# Patient Record
Sex: Male | Born: 1950 | Race: White | Hispanic: No | Marital: Married | Smoking: Never smoker
Health system: Southern US, Community
[De-identification: ages and names within clinical notes are randomized; demographics above are authoritative.]

## PROBLEM LIST (undated history)

## (undated) DIAGNOSIS — E785 Hyperlipidemia, unspecified: Secondary | ICD-10-CM

## (undated) DIAGNOSIS — I10 Essential (primary) hypertension: Secondary | ICD-10-CM

## (undated) HISTORY — DX: Essential (primary) hypertension: I10

## (undated) HISTORY — DX: Hyperlipidemia, unspecified: E78.5

---

## 1987-07-17 HISTORY — PX: KNEE SURGERY: SHX244

## 2015-01-21 ENCOUNTER — Encounter: Payer: Self-pay | Admitting: General Surgery

## 2015-01-21 ENCOUNTER — Encounter: Payer: Self-pay | Admitting: *Deleted

## 2015-01-21 ENCOUNTER — Ambulatory Visit (INDEPENDENT_AMBULATORY_CARE_PROVIDER_SITE_OTHER): Payer: Worker's Compensation | Admitting: General Surgery

## 2015-01-21 VITALS — BP 142/72 | HR 68 | Resp 15

## 2015-01-21 DIAGNOSIS — S8011XA Contusion of right lower leg, initial encounter: Secondary | ICD-10-CM | POA: Diagnosis not present
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
## 2015-01-21 NOTE — Patient Instructions (Addendum)
Shower as usual, pat dry. Return in one week.Advised on rest and elevation of leg for next 2-3 days.

## 2015-01-27 ENCOUNTER — Ambulatory Visit (INDEPENDENT_AMBULATORY_CARE_PROVIDER_SITE_OTHER): Payer: Worker's Compensation | Admitting: General Surgery

## 2015-01-27 ENCOUNTER — Encounter: Payer: Self-pay | Admitting: General Surgery

## 2015-01-27 VITALS — BP 140/76 | Ht 78.0 in | Wt 239.0 lb

## 2015-01-27 DIAGNOSIS — S8011XD Contusion of right lower leg, subsequent encounter: Secondary | ICD-10-CM

## 2015-01-27 NOTE — Progress Notes (Signed)
Patient ID: Larry Keller, male   DOB: June 05, 1951, 64 y.o.   MRN: [id number] Here for follow up from a right leg hematoma I&D. He has had some drainage from the area. He does have some edema in the right foot and ankle. He has finished his antibiotics.   The drainage site is clean with some serous drainage. No surrounding redness and the swelling has subsided significantly. Still has mild edema lower third of leg and ankle.  Patient to return as needed.  PCP: Dr Jerl MinaJames Hedrick

## 2015-01-27 NOTE — Patient Instructions (Signed)
Allow the area to drain. Return as needed.

## 2015-01-29 ENCOUNTER — Other Ambulatory Visit: Payer: Self-pay | Admitting: General Surgery

## 2015-01-29 MED ORDER — DOXYCYCLINE HYCLATE 100 MG PO TABS: 100.0000 mg | ORAL_TABLET | Freq: Two times a day (BID) | ORAL | Status: DC

## 2015-01-31 ENCOUNTER — Ambulatory Visit (INDEPENDENT_AMBULATORY_CARE_PROVIDER_SITE_OTHER): Payer: Worker's Compensation | Admitting: General Surgery

## 2015-01-31 ENCOUNTER — Encounter: Payer: Self-pay | Admitting: General Surgery

## 2015-01-31 VITALS — BP 142/68 | HR 74 | Resp 12 | Ht 78.0 in | Wt 240.0 lb

## 2015-01-31 DIAGNOSIS — S8011XD Contusion of right lower leg, subsequent encounter: Secondary | ICD-10-CM

## 2015-01-31 DIAGNOSIS — T888XXS Other specified complications of surgical and medical care, not elsewhere classified, sequela: Secondary | ICD-10-CM

## 2015-01-31 MED ORDER — METRONIDAZOLE 250 MG PO TABS: 250.0000 mg | ORAL_TABLET | Freq: Three times a day (TID) | ORAL | Status: DC

## 2015-01-31 NOTE — Patient Instructions (Addendum)
Patient to rest and keep leg elevated as much as possible. Continue course of antibiotics. If no improvement in the next couple of days, contact office.
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
## 2015-02-01 ENCOUNTER — Encounter: Payer: Self-pay | Admitting: *Deleted

## 2015-02-01 ENCOUNTER — Telehealth: Payer: Self-pay | Admitting: *Deleted

## 2015-02-01 ENCOUNTER — Encounter: Payer: Self-pay | Admitting: General Surgery

## 2015-02-01 NOTE — Telephone Encounter (Signed)
Patients wife called and wanted to see if he can get a work note with limited abilities and to take it easy for a couple of days. Patients wife just wants you to call her back to see if that is what he really needs and for how long.

## 2015-02-08 ENCOUNTER — Inpatient Hospital Stay
Admission: AD | Admit: 2015-02-08 | Discharge: 2015-02-11 | DRG: 603 | Disposition: A | Discharge status: Home / Self Care | Payer: Worker's Compensation | Source: Ambulatory Visit | Attending: General Surgery | Admitting: General Surgery

## 2015-02-08 ENCOUNTER — Encounter: Payer: Self-pay | Admitting: *Deleted

## 2015-02-08 ENCOUNTER — Ambulatory Visit (INDEPENDENT_AMBULATORY_CARE_PROVIDER_SITE_OTHER): Payer: Worker's Compensation | Admitting: General Surgery

## 2015-02-08 ENCOUNTER — Encounter: Payer: Self-pay | Admitting: General Surgery

## 2015-02-08 ENCOUNTER — Inpatient Hospital Stay: Payer: Worker's Compensation

## 2015-02-08 VITALS — BP 130/66 | HR 76 | Resp 12 | Ht 78.0 in | Wt 240.0 lb

## 2015-02-08 DIAGNOSIS — Z833 Family history of diabetes mellitus: Secondary | ICD-10-CM | POA: Diagnosis not present

## 2015-02-08 DIAGNOSIS — E785 Hyperlipidemia, unspecified: Secondary | ICD-10-CM | POA: Diagnosis present

## 2015-02-08 DIAGNOSIS — L02419 Cutaneous abscess of limb, unspecified: Secondary | ICD-10-CM

## 2015-02-08 DIAGNOSIS — L03115 Cellulitis of right lower limb: Principal | ICD-10-CM | POA: Diagnosis present

## 2015-02-08 DIAGNOSIS — L03119 Cellulitis of unspecified part of limb: Secondary | ICD-10-CM | POA: Diagnosis present

## 2015-02-08 DIAGNOSIS — Z809 Family history of malignant neoplasm, unspecified: Secondary | ICD-10-CM

## 2015-02-08 DIAGNOSIS — Z9889 Other specified postprocedural states: Secondary | ICD-10-CM

## 2015-02-08 DIAGNOSIS — Z8249 Family history of ischemic heart disease and other diseases of the circulatory system: Secondary | ICD-10-CM | POA: Diagnosis not present

## 2015-02-08 DIAGNOSIS — I10 Essential (primary) hypertension: Secondary | ICD-10-CM | POA: Diagnosis present

## 2015-02-08 DIAGNOSIS — S8011XD Contusion of right lower leg, subsequent encounter: Secondary | ICD-10-CM

## 2015-02-08 DIAGNOSIS — L02415 Cutaneous abscess of right lower limb: Secondary | ICD-10-CM | POA: Diagnosis present

## 2015-02-08 LAB — CREATININE, SERUM
Creatinine, Ser: 0.96 mg/dL (ref 0.61–1.24)
GFR calc non Af Amer: >60 mL/min (ref >60)

## 2015-02-08 LAB — CBC WITH DIFFERENTIAL/PLATELET
Basophils Absolute: 0 10*3/uL (ref 0–0.1)
Basophils Relative: 1 %
EOS ABS: 0.1 10*3/uL (ref 0–0.7)
EOS PCT: 2 %
HCT: 39.8 % — ABNORMAL LOW (ref 40.0–52.0)
HEMOGLOBIN: 13.4 g/dL (ref 13.0–18.0)
Lymphocytes Relative: 31 %
Lymphs Abs: 1.8 10*3/uL (ref 1.0–3.6)
MCH: 30.7 pg (ref 26.0–34.0)
MCHC: 33.5 g/dL (ref 32.0–36.0)
MCV: 91.6 fL (ref 80.0–100.0)
MONOS PCT: 9 %
Monocytes Absolute: 0.5 10*3/uL (ref 0.2–1.0)
NEUTROS PCT: 57 %
Neutro Abs: 3.3 10*3/uL (ref 1.4–6.5)
PLATELETS: 190 10*3/uL (ref 150–440)
RBC: 4.35 MIL/uL — ABNORMAL LOW (ref 4.40–5.90)
RDW: 13.9 % (ref 11.5–14.5)
WBC: 5.7 10*3/uL (ref 3.8–10.6)

## 2015-02-08 MED ORDER — OXYCODONE HCL 5 MG PO TABS: 5.0000 mg | ORAL_TABLET | ORAL | Status: DC | PRN

## 2015-02-08 MED ORDER — GADOBENATE DIMEGLUMINE 529 MG/ML IV SOLN
20.0000 mL | Freq: Once | INTRAVENOUS | Status: AC | PRN
  Administered 2015-02-08: 20 mL via INTRAVENOUS

## 2015-02-08 MED ORDER — METRONIDAZOLE IN NACL 5-0.79 MG/ML-% IV SOLN
500.0000 mg | Freq: Three times a day (TID) | INTRAVENOUS | Status: DC
  Administered 2015-02-08 – 2015-02-11 (×9): 500 mg via INTRAVENOUS
  Filled 2015-02-08 (×13): qty 100

## 2015-02-08 MED ORDER — ACETAMINOPHEN 325 MG PO TABS
650.0000 mg | ORAL_TABLET | Freq: Four times a day (QID) | ORAL | Status: DC | PRN
  Administered 2015-02-09: 650 mg via ORAL
  Filled 2015-02-08: qty 2

## 2015-02-08 MED ORDER — SODIUM CHLORIDE 0.45 % IV SOLN
INTRAVENOUS | Status: DC
  Administered 2015-02-08 – 2015-02-09 (×2): via INTRAVENOUS

## 2015-02-08 MED ORDER — ACETAMINOPHEN 650 MG RE SUPP: 650.0000 mg | Freq: Four times a day (QID) | RECTAL | Status: DC | PRN

## 2015-02-08 MED ORDER — CIPROFLOXACIN IN D5W 400 MG/200ML IV SOLN
400.0000 mg | Freq: Two times a day (BID) | INTRAVENOUS | Status: DC
  Administered 2015-02-08 – 2015-02-10 (×6): 400 mg via INTRAVENOUS
  Filled 2015-02-08 (×10): qty 200

## 2015-02-08 NOTE — Anesthesia Preprocedure Evaluation (Addendum)
Anesthesia Evaluation  Patient identified by MRN, date of birth, ID band Patient awake    Reviewed: Allergy & Precautions, H&P , NPO status , Patient's Chart, lab work & pertinent test results, reviewed documented beta blocker date and time   Airway Mallampati: II  TM Distance: >3 FB Neck ROM: full    Dental   Pulmonary          Cardiovascular hypertension, Normal cardiovascular examRate:Normal     Neuro/Psych    GI/Hepatic   Endo/Other    Renal/GU      Musculoskeletal   Abdominal   Peds  Hematology   Anesthesia Other Findings   Reproductive/Obstetrics                            Anesthesia Physical Anesthesia Plan  ASA: II  Anesthesia Plan: General LMA   Post-op Pain Management:    Induction:   Airway Management Planned:   Additional Equipment:   Intra-op Plan:   Post-operative Plan:   Informed Consent: I have reviewed the patients History and Physical, chart, labs and discussed the procedure including the risks, benefits and alternatives for the proposed anesthesia with the patient or authorized representative who has indicated his/her understanding and acceptance.     Plan Discussed with: CRNA  Anesthesia Plan Comments:         Anesthesia Quick Evaluation

## 2015-02-08 NOTE — Progress Notes (Signed)
Patient ID: Larry Keller, male   DOB: 1951-06-30, 64 y.o.   MRN: [id number]  Chief Complaint  Patient presents with  . Follow-up    right leg hematoma     HPI [name] is a 64 y.o. male here today following up right leg hematoma and subsequent abscess. Patient states his legs is not getting any better since his last visit. Patient finished his antibiotic last night-doxycycline and flagyl  HPI  Past Medical History  Diagnosis Date  . Hypertension   . Hyperlipidemia     Past Surgical History  Procedure Laterality Date  . Knee surgery Right 1989    Family History  Problem Relation Age of Onset  . Heart disease Mother   . Cancer Mother   . Diabetes Mother   . Heart disease Father     Social History History  Substance Use Topics  . Smoking status: Never Smoker   . Smokeless tobacco: Not on file  . Alcohol Use: No    No Known Allergies  Current Outpatient Prescriptions  Medication Sig Dispense Refill  . aspirin 81 MG tablet Take 81 mg by mouth daily.    Marland Kitchen atorvastatin (LIPITOR) 10 MG tablet Take 10 mg by mouth daily.    . cholecalciferol (VITAMIN D) 400 UNITS TABS tablet Take 400 Units by mouth.    . doxycycline (VIBRA-TABS) 100 MG tablet Take 1 tablet (100 mg total) by mouth 2 (two) times daily. 20 tablet 0  . Fish Oil-Cholecalciferol (FISH OIL + D3) 1000-1000 MG-UNIT CAPS Take by mouth.    . metroNIDAZOLE (FLAGYL) 250 MG tablet Take 1 tablet (250 mg total) by mouth 3 (three) times daily. 21 tablet 0  . valsartan-hydrochlorothiazide (DIOVAN-HCT) 160-12.5 MG per tablet Take 1 tablet by mouth daily.     No current facility-administered medications for this visit.    Review of Systems Review of Systems  Constitutional: Negative.   Respiratory: Negative.   Cardiovascular: Negative.     Blood pressure 130/66, pulse 76, resp. rate 12, height  (1.981 m), weight 240 lb (108.863 kg).  Physical Exam Physical Exam  Constitutional: He appears  well-developed.  Eyes: Conjunctivae are normal. No scleral icterus.  Neck: Neck supple.  Cardiovascular: Normal rate, regular rhythm and normal heart sounds.   Pulmonary/Chest: Effort normal and breath sounds normal.  Abdominal: Soft. Bowel sounds are normal. There is no tenderness.  Lymphadenopathy:    He has no cervical adenopathy.  Neurological: He is alert.  Skin: Skin is warm and dry.       Data Reviewed Notes reviewed and culture report reviewed.  Assessment    Persistent infection of right leg from prior hematoma. Failed treatment as an outpatient.     Plan    Admit to [facility]. IV antibiotics. Plan for more adequate drainage in OR. Discussed fully with patient and he is agreeable. Will get imaging of the area to assess extent of the infected process.      PCP:  Raul Del 02/08/2015, 9:34 AM

## 2015-02-08 NOTE — Patient Instructions (Addendum)
Admit to [facility] for IV antibiotics. Plan for more adequate drainage in OR.

## 2015-02-09 ENCOUNTER — Inpatient Hospital Stay: Admission: RE | Admit: 2015-02-09 | Payer: Worker's Compensation | Source: Ambulatory Visit | Admitting: General Surgery

## 2015-02-09 ENCOUNTER — Inpatient Hospital Stay: Payer: Worker's Compensation | Admitting: Anesthesiology

## 2015-02-09 ENCOUNTER — Encounter: Payer: Self-pay | Admitting: General Surgery

## 2015-02-09 ENCOUNTER — Encounter
Admission: AD | Disposition: A | Discharge status: Home / Self Care | Payer: Self-pay | Source: Ambulatory Visit | Attending: General Surgery

## 2015-02-09 HISTORY — PX: IRRIGATION AND DEBRIDEMENT ABSCESS: SHX5252

## 2015-02-09 SURGERY — IRRIGATION AND DEBRIDEMENT ABSCESS
Anesthesia: General | Laterality: Right

## 2015-02-09 MED ORDER — ONDANSETRON HCL 4 MG/2ML IJ SOLN: 4.0000 mg | Freq: Once | INTRAMUSCULAR | Status: AC | PRN

## 2015-02-09 MED ORDER — MIDAZOLAM HCL 2 MG/2ML IJ SOLN
INTRAMUSCULAR | Status: DC | PRN
  Administered 2015-02-09: 2 mg via INTRAVENOUS

## 2015-02-09 MED ORDER — FENTANYL CITRATE (PF) 100 MCG/2ML IJ SOLN
INTRAMUSCULAR | Status: DC | PRN
  Administered 2015-02-09: 50 ug via INTRAVENOUS

## 2015-02-09 MED ORDER — ONDANSETRON HCL 4 MG/2ML IJ SOLN
INTRAMUSCULAR | Status: DC | PRN
  Administered 2015-02-09: 4 mg via INTRAVENOUS

## 2015-02-09 MED ORDER — LIDOCAINE HCL (CARDIAC) 20 MG/ML IV SOLN
INTRAVENOUS | Status: DC | PRN
  Administered 2015-02-09: 100 mg via INTRAVENOUS

## 2015-02-09 MED ORDER — PROPOFOL 10 MG/ML IV BOLUS
INTRAVENOUS | Status: DC | PRN
  Administered 2015-02-09: 160 mg via INTRAVENOUS

## 2015-02-09 MED ORDER — FENTANYL CITRATE (PF) 100 MCG/2ML IJ SOLN: 25.0000 ug | INTRAMUSCULAR | Status: DC | PRN

## 2015-02-09 MED ORDER — LACTATED RINGERS IV SOLN
INTRAVENOUS | Status: DC | PRN
  Administered 2015-02-09: 15:00:00 via INTRAVENOUS

## 2015-02-09 SURGICAL SUPPLY — 24 items
CANISTER SUCT 1200ML W/VALVE (MISCELLANEOUS) ×3 IMPLANT
CHLORAPREP W/TINT 26ML (MISCELLANEOUS) IMPLANT
CLOSURE WOUND 1/2 X4 (GAUZE/BANDAGES/DRESSINGS)
DRAIN PENROSE 1/4X12 LTX (DRAIN) ×3 IMPLANT
DRAPE LAPAROTOMY 100X77 ABD (DRAPES) ×3 IMPLANT
DRSG TEGADERM 4X4.75 (GAUZE/BANDAGES/DRESSINGS) ×3 IMPLANT
DRSG TELFA 3X8 NADH (GAUZE/BANDAGES/DRESSINGS) ×3 IMPLANT
GAUZE SPONGE 4X4 12PLY STRL (GAUZE/BANDAGES/DRESSINGS) IMPLANT
GLOVE BIO SURGEON STRL SZ7 (GLOVE) IMPLANT
GOWN STRL REUS W/ TWL LRG LVL3 (GOWN DISPOSABLE) ×3 IMPLANT
GOWN STRL REUS W/TWL LRG LVL3 (GOWN DISPOSABLE) ×6
KIT RM TURNOVER STRD PROC AR (KITS) ×3 IMPLANT
LABEL OR SOLS (LABEL) ×3 IMPLANT
NDL SAFETY 25GX1.5 (NEEDLE) IMPLANT
NS IRRIG 500ML POUR BTL (IV SOLUTION) ×3 IMPLANT
PACK BASIN MINOR ARMC (MISCELLANEOUS) ×3 IMPLANT
PAD GROUND ADULT SPLIT (MISCELLANEOUS) ×15 IMPLANT
STRIP CLOSURE SKIN 1/2X4 (GAUZE/BANDAGES/DRESSINGS) IMPLANT
SUT ETHILON 3-0 KS 30 BLK (SUTURE) ×3 IMPLANT
SUT VIC AB 3-0 SH 27 (SUTURE) ×2
SUT VIC AB 3-0 SH 27X BRD (SUTURE) ×1 IMPLANT
SUT VIC AB 4-0 FS2 27 (SUTURE) ×3 IMPLANT
SWABSTK COMLB BENZOIN TINCTURE (MISCELLANEOUS) IMPLANT
SYR CONTROL 10ML (SYRINGE) IMPLANT

## 2015-02-09 NOTE — Anesthesia Procedure Notes (Signed)
Procedure Name: LMA Insertion Date/Time: 02/09/2015 2:42 PM Performed by: Stormy Fabian Pre-anesthesia Checklist: Patient identified, Patient being monitored, Timeout performed, Emergency Drugs available and Suction available Patient Re-evaluated:Patient Re-evaluated prior to inductionOxygen Delivery Method: Circle system utilized Preoxygenation: Pre-oxygenation with 100% oxygen Intubation Type: IV induction Ventilation: Mask ventilation without difficulty LMA: LMA inserted LMA Size: 4.5 Tube type: Oral Number of attempts: 1 Placement Confirmation: positive ETCO2 and breath sounds checked- equal and bilateral Tube secured with: Tape Dental Injury: Teeth and Oropharynx as per pre-operative assessment

## 2015-02-09 NOTE — Op Note (Signed)
Preop diagnosis:Persistent abscess right leg anterior   Post op diagnosis: same  Operation: drainage of right leg abscess   Surgeon: Timoteo Expose. Jerrianne Hartin  Assistant:    Anesthesia: General   Complications: none  EBL: minimal  Drains: 2 quarter inch Penrose drains  Description: Patient was put to sleep in supine position on the operating table. Right leg prepped and draped out on sterile field. Time out performed. The abscess was located mid anterior tibial region. The scabbed area was excised out and the abscess in the subcutaneous space was opened and suctioned out. Culture was sent. Pus was minimal but there was a fair amount of inflamed fatty tissue. Penrose drains were placed and anchored with 4.0 Nylon stitches. Dressed with four by fours and kling. Patient subsequently was extubated and returned to PACU in stable condition.

## 2015-02-09 NOTE — Transfer of Care (Signed)
Immediate Anesthesia Transfer of Care Note  Patient: Larry Keller  Procedure(s) Performed: Procedure(s): IRRIGATION AND DEBRIDEMENT ABSCESS/RIGHT LEG ABSCESS (Right)  Patient Location: PACU  Anesthesia Type:General  Level of Consciousness: sedated  Airway & Oxygen Therapy: Patient Spontanous Breathing and Patient connected to face mask oxygen  Post-op Assessment: Report given to RN and Post -op Vital signs reviewed and stable  Post vital signs: Reviewed and stable  Last Vitals:  Filed Vitals:   02/09/15 1518  BP: 107/63  Pulse: 44  Temp: 36.3 C  Resp: 15    Complications: No apparent anesthesia complications

## 2015-02-10 MED ORDER — SODIUM CHLORIDE 0.9 % IJ SOLN
3.0000 mL | Freq: Four times a day (QID) | INTRAMUSCULAR | Status: DC
  Administered 2015-02-10 – 2015-02-11 (×3): 3 mL via INTRAVENOUS

## 2015-02-10 NOTE — Progress Notes (Signed)
Patient ID: NASIF BOS, male   DOB: [birth year], 63 y.o.   MRN: [id number] No complaints. AVSS. Right leg: Swelling and redness decreased. Edema less. Drains intact-minimal spotting. Continue IV Cipro and Flagyl. If continued improvement will discharge tomorrow am.

## 2015-02-10 NOTE — Anesthesia Postprocedure Evaluation (Signed)
  Anesthesia Post-op Note  Patient: Larry Keller  Procedure(s) Performed: Procedure(s): IRRIGATION AND DEBRIDEMENT ABSCESS/RIGHT LEG ABSCESS (Right)  Anesthesia type:General LMA  Patient location: PACU  Post pain: Pain level controlled  Post assessment: Post-op Vital signs reviewed, Patient's Cardiovascular Status Stable, Respiratory Function Stable, Patent Airway and No signs of Nausea or vomiting  Post vital signs: Reviewed and stable  Last Vitals:  Filed Vitals:   02/10/15 1200  BP: 128/67  Pulse: 55  Temp: 37.1 C  Resp: 16    Level of consciousness: awake, alert  and patient cooperative  Complications: No apparent anesthesia complications

## 2015-02-11 MED ORDER — CIPROFLOXACIN HCL 500 MG PO TABS: 500.0000 mg | ORAL_TABLET | Freq: Two times a day (BID) | ORAL | Status: AC

## 2015-02-11 MED ORDER — CIPROFLOXACIN HCL 500 MG PO TABS
500.0000 mg | ORAL_TABLET | Freq: Two times a day (BID) | ORAL | Status: DC
  Administered 2015-02-11: 500 mg via ORAL
  Filled 2015-02-11: qty 1

## 2015-02-11 MED ORDER — METRONIDAZOLE 500 MG PO TABS
500.0000 mg | ORAL_TABLET | Freq: Three times a day (TID) | ORAL | Status: AC
Start: 2015-02-11 — End: ?

## 2015-02-11 MED ORDER — METRONIDAZOLE 500 MG PO TABS
500.0000 mg | ORAL_TABLET | Freq: Three times a day (TID) | ORAL | Status: DC
  Administered 2015-02-11: 500 mg via ORAL
  Filled 2015-02-11: qty 1

## 2015-02-11 NOTE — Progress Notes (Signed)
Pt VSS; No complaints of pain nor nausea; tolerating diet; Dr. Evette Cristal changed dressing. Pt received discharge orders per SCM. Discharge instructions were reviewed with pt with all questions answered.  IV removed with dressing dry and intact. Pt was escorted out via wheelchair.
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
## 2015-02-11 NOTE — Care Management Note (Signed)
Case Management Note  Patient Details  Name: KAYON DOZIER MRN: [id number] Date of Birth: 08/28/1950  Subjective/Objective:                    Action/Plan: Patient discharging home today. I spoke with him and he states his workers' comp has been taken care. He denies RNCM needs. Case closed.   Expected Discharge Date:                  Expected Discharge Plan:     In-House Referral:     Discharge planning Services  CM Consult  Post Acute Care Choice:    Choice offered to:  Patient  DME Arranged:    DME Agency:     HH Arranged:    HH Agency:     Status of Service:  Completed, signed off  Medicare Important Message Given:    Date Medicare IM Given:    Medicare IM give by:    Date Additional Medicare IM Given:    Additional Medicare Important Message give by:     If discussed at Long Length of Stay Meetings, dates discussed:    Additional Comments:  Collie Siad, RN 02/11/2015, 10:39 AM

## 2015-02-11 NOTE — Discharge Instructions (Signed)
Please contact your physician for any increase in redness, swelling, bleeding or discharge from your incision; fever greater than 100.4; pain uncontrolled by what you have been instructed to take; or any other questions or concerns.  Cellulitis Cellulitis is an infection of the skin and the tissue under the skin. The infected area is usually red and tender. This happens most often in the arms and lower legs. HOME CARE   Take your antibiotic medicine as told. Finish the medicine even if you start to feel better.  Keep the infected arm or leg raised (elevated).  Put a warm cloth on the area up to 4 times per day.  Only take medicines as told by your doctor.  Keep all doctor visits as told. GET HELP IF:  You see red streaks on the skin coming from the infected area.  Your red area gets bigger or turns a dark color.  Your bone or joint under the infected area is painful after the skin heals.  Your infection comes back in the same area or different area.  You have a puffy (swollen) bump in the infected area.  You have new symptoms.  You have a fever. GET HELP RIGHT AWAY IF:   You feel very sleepy.  You throw up (vomit) or have watery poop (diarrhea).  You feel sick and have muscle aches and pains. MAKE SURE YOU:   Understand these instructions.  Will watch your condition.  Will get help right away if you are not doing well or get worse. Document Released: 12/19/2007 Document Revised: 11/16/2013 Document Reviewed: 09/17/2011 [facility] Patient Information 2015 [place], [place]. This information is not intended to replace advice given to you by your health care provider. Make sure you discuss any questions you have with your health care provider.

## 2015-02-13 LAB — CULTURE, ROUTINE-ABSCESS

## 2015-02-13 LAB — ANAEROBIC CULTURE

## 2015-02-15 ENCOUNTER — Ambulatory Visit (INDEPENDENT_AMBULATORY_CARE_PROVIDER_SITE_OTHER): Payer: Worker's Compensation | Admitting: General Surgery

## 2015-02-15 ENCOUNTER — Encounter: Payer: Self-pay | Admitting: General Surgery

## 2015-02-15 VITALS — BP 144/78 | HR 72 | Resp 14 | Ht 75.0 in | Wt 238.0 lb

## 2015-02-15 DIAGNOSIS — S8011XD Contusion of right lower leg, subsequent encounter: Secondary | ICD-10-CM
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
## 2015-02-16 ENCOUNTER — Telehealth: Payer: Self-pay | Admitting: *Deleted

## 2015-02-16 NOTE — Telephone Encounter (Signed)
Needs a updated work note for August, he said you did one for him in July but now needs one for August. He will come pick it up when its ready.

## 2015-02-16 NOTE — Telephone Encounter (Signed)
Work note completed, pt aware

## 2015-03-17 ENCOUNTER — Telehealth: Payer: Self-pay | Admitting: *Deleted

## 2015-03-17 ENCOUNTER — Ambulatory Visit: Payer: Worker's Compensation | Admitting: General Surgery

## 2015-03-17 NOTE — Telephone Encounter (Signed)
He states he is doing good, no issues, area all healed. Appreciates all your care.
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
